# Patient Record
Sex: Male | Born: 1980 | Smoking: Never smoker
Health system: Southern US, Community
[De-identification: ages and names within clinical notes are randomized; demographics above are authoritative.]

## PROBLEM LIST (undated history)

## (undated) DIAGNOSIS — E291 Testicular hypofunction: Secondary | ICD-10-CM

## (undated) HISTORY — PX: LASIK: SHX215

## (undated) HISTORY — PX: WISDOM TOOTH EXTRACTION: SHX21

---

## 2017-06-16 ENCOUNTER — Emergency Department: Payer: Self-pay

## 2017-06-16 ENCOUNTER — Emergency Department
Admission: EM | Admit: 2017-06-16 | Discharge: 2017-06-16 | Disposition: A | Discharge status: Home / Self Care | Payer: Self-pay | Attending: Emergency Medicine | Admitting: Emergency Medicine

## 2017-06-16 DIAGNOSIS — Z5321 Procedure and treatment not carried out due to patient leaving prior to being seen by health care provider: Secondary | ICD-10-CM | POA: Insufficient documentation

## 2017-06-16 DIAGNOSIS — R079 Chest pain, unspecified: Secondary | ICD-10-CM | POA: Insufficient documentation

## 2017-06-16 HISTORY — DX: Testicular hypofunction: E29.1

## 2017-06-16 LAB — BASIC METABOLIC PANEL
ANION GAP: 9 (ref 5–15)
BUN: 23 mg/dL — AB (ref 6–20)
CALCIUM: 9.1 mg/dL (ref 8.9–10.3)
CO2: 28 mmol/L (ref 22–32)
CREATININE: 1 mg/dL (ref 0.61–1.24)
Chloride: 103 mmol/L (ref 101–111)
GFR calc Af Amer: >60 mL/min (ref >60)
GLUCOSE: 135 mg/dL — AB (ref 65–99)
Potassium: 3.9 mmol/L (ref 3.5–5.1)
Sodium: 140 mmol/L (ref 135–145)

## 2017-06-16 LAB — CBC
HCT: 44.7 % (ref 40.0–52.0)
Hemoglobin: 15.4 g/dL (ref 13.0–18.0)
MCH: 29.8 pg (ref 26.0–34.0)
MCHC: 34.5 g/dL (ref 32.0–36.0)
MCV: 86.6 fL (ref 80.0–100.0)
Platelets: 179 10*3/uL (ref 150–440)
RBC: 5.16 MIL/uL (ref 4.40–5.90)
RDW: 13.8 % (ref 11.5–14.5)
WBC: 6.6 10*3/uL (ref 3.8–10.6)

## 2017-06-16 LAB — TROPONIN I

## 2017-06-16 NOTE — ED Notes (Signed)
Pt states he's leaving at this time d/t extended wait time and because he needs to work this morning. Pt very appreciative of attention given and response of staff upon arrival; states he was mainly concerned about getting an EKG and reports he will follow-up if needed.

## 2018-09-06 IMAGING — CR DG CHEST 2V
2 series · 2 of 2 positions shown · non-contrast
Comparison: None.

CLINICAL DATA: Woke up at [DATE] a.m. with LEFT chest pain.

EXAM:
CHEST  2 VIEW

[chest pa]
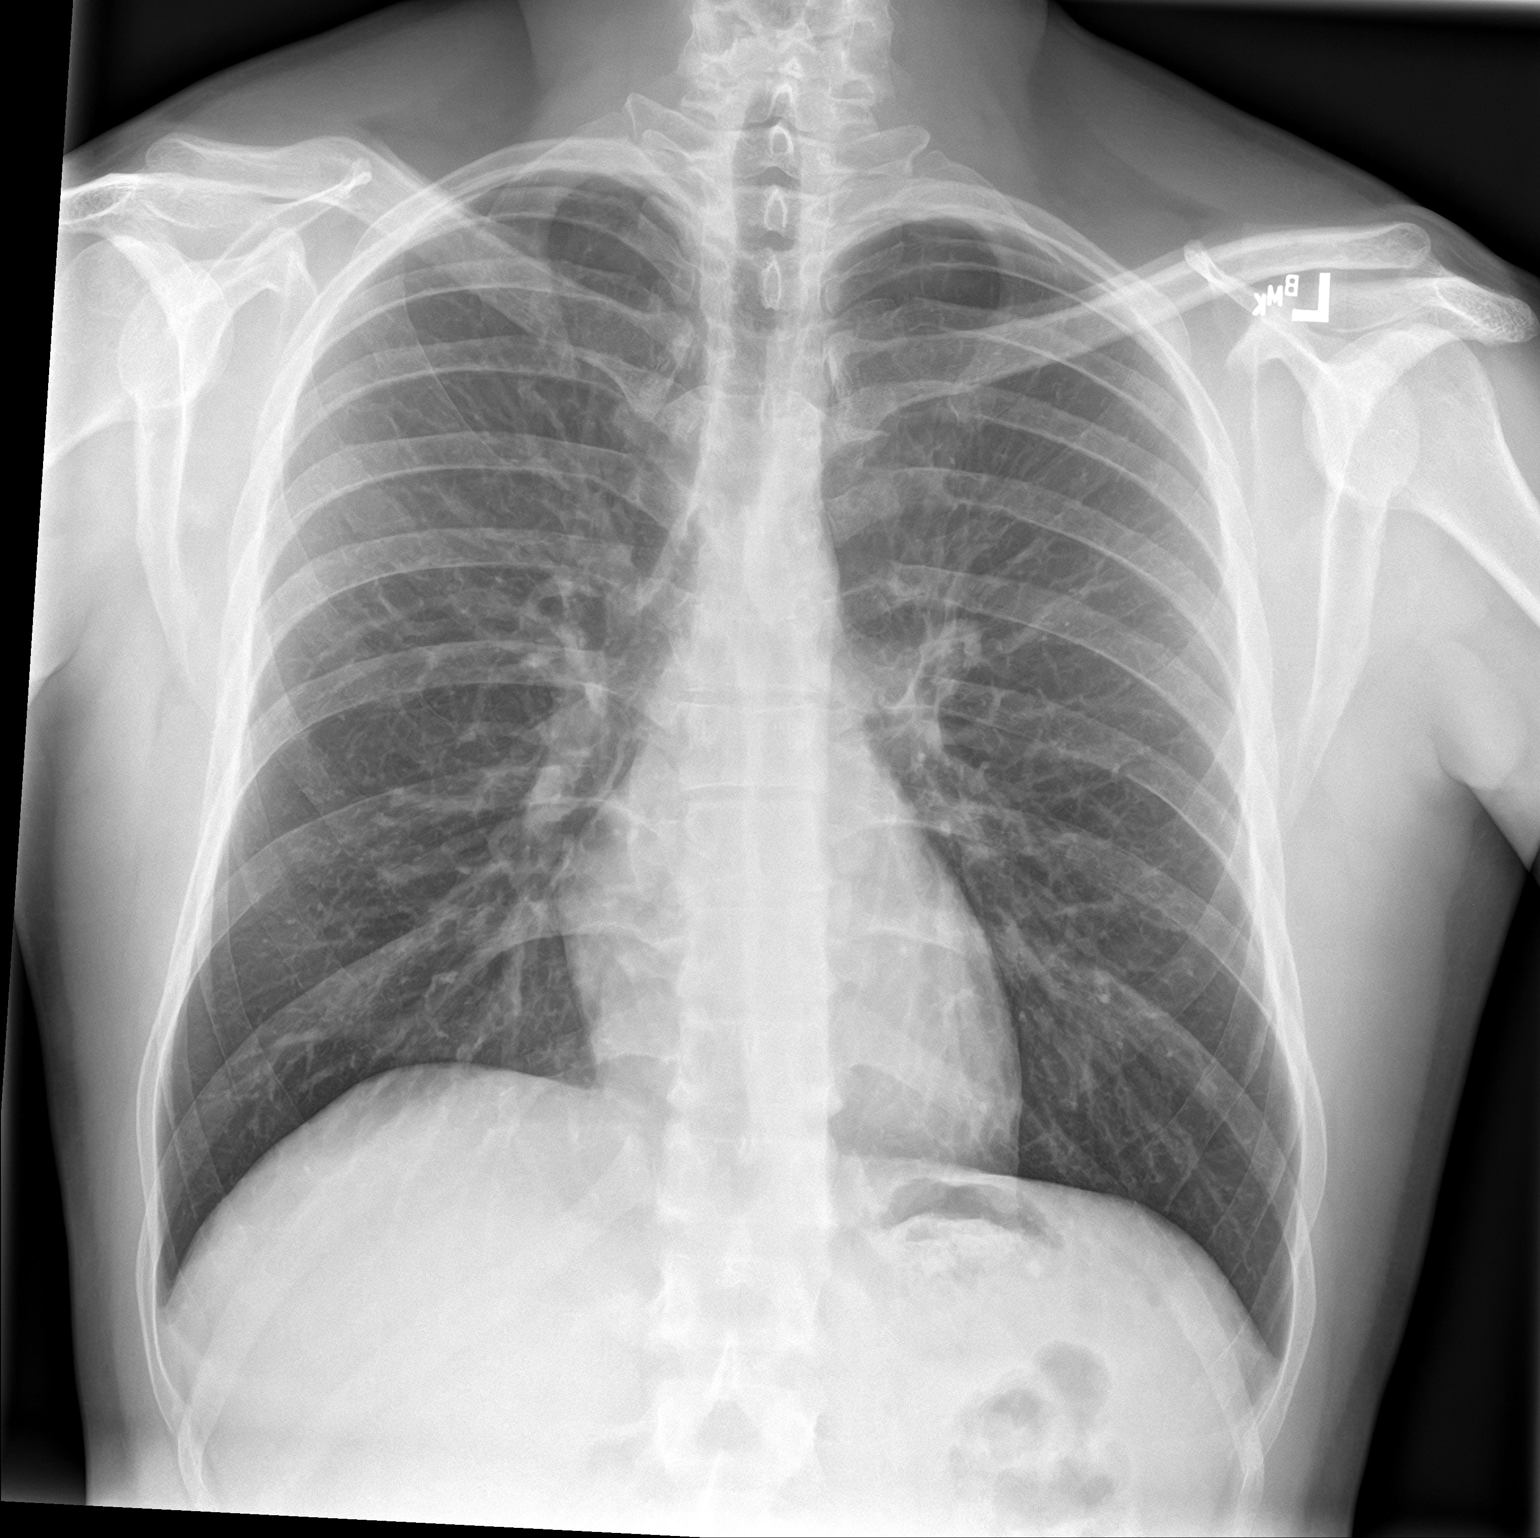

[chest lat]
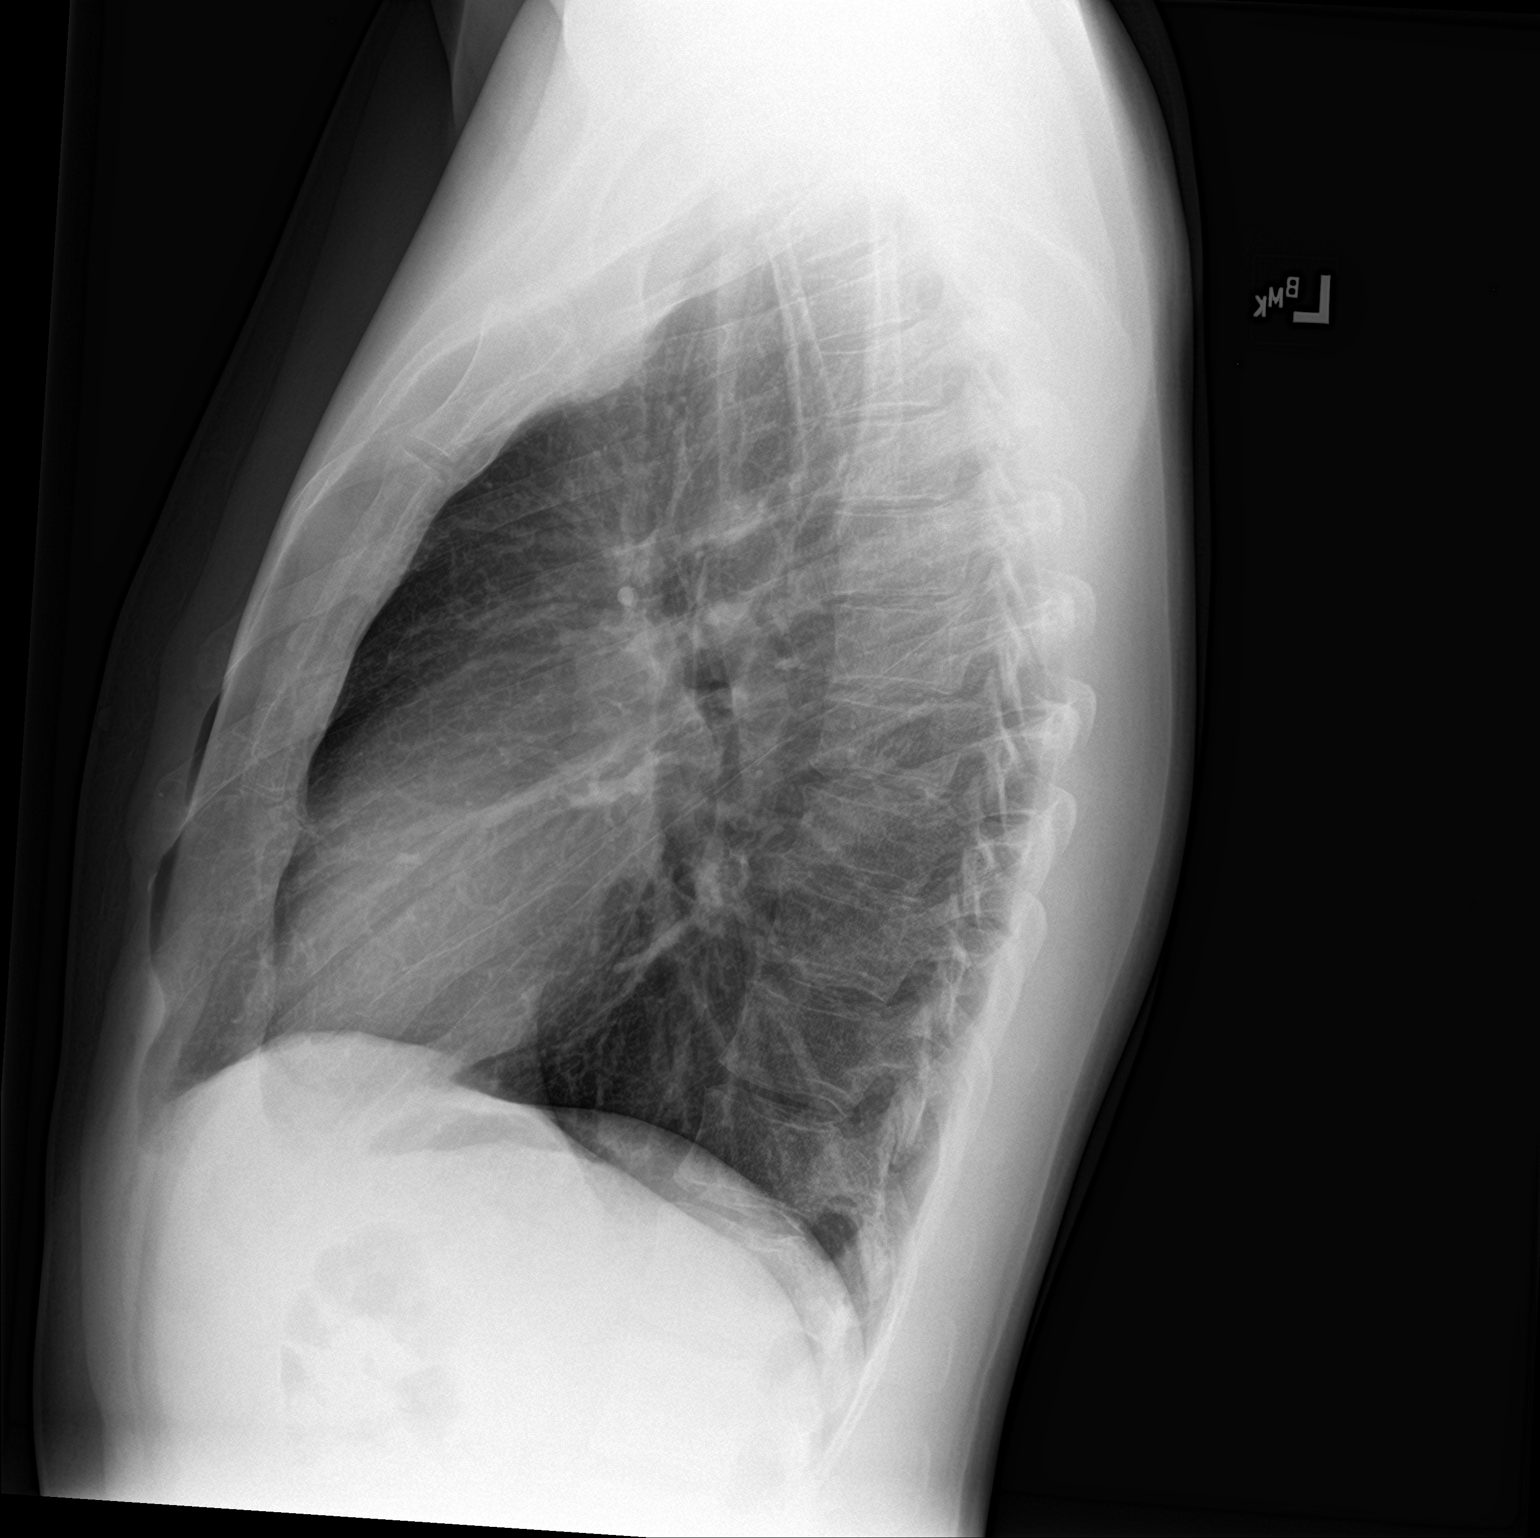

[2 of 2 positions shown; findings below may reference images not displayed]

FINDINGS: Cardiomediastinal silhouette is normal. No pleural effusions or
focal consolidations. Trachea projects midline and there is no
pneumothorax. Soft tissue planes and included osseous structures are
non-suspicious. Mild wedging approximate T4.
IMPRESSION: Mild age-indeterminate wedging approximate T4.

No acute cardiopulmonary process.
